# Patient Record
Sex: Male | Born: 1990 | Race: Black or African American | Hispanic: No | Marital: Single | Smoking: Current some day smoker
Health system: Southern US, Community
[De-identification: ages and names within clinical notes are randomized; demographics above are authoritative.]

---
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
## 2010-05-03 ENCOUNTER — Emergency Department (HOSPITAL_COMMUNITY): Admission: EM | Admit: 2010-05-03 | Discharge: 2010-05-03 | Payer: Self-pay | Admitting: Family Medicine

## 2014-04-28 ENCOUNTER — Emergency Department (HOSPITAL_COMMUNITY)
Admission: EM | Admit: 2014-04-28 | Discharge: 2014-04-28 | Disposition: A | Discharge status: Home / Self Care | Payer: Worker's Compensation | Attending: Emergency Medicine | Admitting: Emergency Medicine

## 2014-04-28 ENCOUNTER — Encounter (HOSPITAL_COMMUNITY): Payer: Self-pay | Admitting: Emergency Medicine

## 2014-04-28 DIAGNOSIS — S40022D Contusion of left upper arm, subsequent encounter: Secondary | ICD-10-CM

## 2014-04-28 DIAGNOSIS — G8911 Acute pain due to trauma: Secondary | ICD-10-CM | POA: Insufficient documentation

## 2014-04-28 DIAGNOSIS — M79609 Pain in unspecified limb: Secondary | ICD-10-CM | POA: Diagnosis not present

## 2014-04-28 DIAGNOSIS — T1490XA Injury, unspecified, initial encounter: Secondary | ICD-10-CM

## 2014-04-28 DIAGNOSIS — F17200 Nicotine dependence, unspecified, uncomplicated: Secondary | ICD-10-CM | POA: Insufficient documentation

## 2014-04-28 DIAGNOSIS — Y9229 Other specified public building as the place of occurrence of the external cause: Secondary | ICD-10-CM

## 2014-04-28 LAB — CBC WITH DIFFERENTIAL/PLATELET
Basophils Absolute: 0 10*3/uL (ref 0.0–0.1)
Basophils Relative: 0 % (ref 0–1)
EOS PCT: 2 % (ref 0–5)
Eosinophils Absolute: 0.1 10*3/uL (ref 0.0–0.7)
HEMATOCRIT: 44.9 % (ref 39.0–52.0)
Hemoglobin: 15.3 g/dL (ref 13.0–17.0)
LYMPHS ABS: 1.4 10*3/uL (ref 0.7–4.0)
Lymphocytes Relative: 22 % (ref 12–46)
MCH: 32.3 pg (ref 26.0–34.0)
MCHC: 34.1 g/dL (ref 30.0–36.0)
MCV: 94.9 fL (ref 78.0–100.0)
MONO ABS: 1 10*3/uL (ref 0.1–1.0)
Monocytes Relative: 16 % — ABNORMAL HIGH (ref 3–12)
Neutro Abs: 3.8 10*3/uL (ref 1.7–7.7)
Neutrophils Relative %: 60 % (ref 43–77)
PLATELETS: 260 10*3/uL (ref 150–400)
RBC: 4.73 MIL/uL (ref 4.22–5.81)
RDW: 12.2 % (ref 11.5–15.5)
WBC: 6.3 10*3/uL (ref 4.0–10.5)

## 2014-04-28 MED ORDER — HYDROCODONE-ACETAMINOPHEN 5-325 MG PO TABS: ORAL_TABLET | ORAL | Status: DC

## 2014-04-28 NOTE — ED Notes (Signed)
The patient said he collided with a boiler at work ([employer]).  He went to Urgent Care and had it xrayed and they said nothing was broken.  He is here because he is hurting worse and it is even more red and swollen.  He denies burning it, he said it was more of a "collision".

## 2014-04-28 NOTE — ED Provider Notes (Signed)
Chief Complaint    Chief Complaint  Patient presents with  . Arm Injury    The patient said he collided with a boiler at work [name].  He went to Urgent Care and had it xrayed and they said nothing was broken.  He is here because he is hurting worse and it is even more red and swollen.    History of Present Illness     Arthur Murray is a 23 year old male who injured his left forearm while at work one week ago. He turned suddenly, striking his left lower forearm against a steel broiler. This immediately swelled up and was painful, additionally, it has gotten progressively worse. There's some discoloration, erythema, and he has some decreased range of motion at the elbow. He's had no fever or chills. He went to an urgent care today called [facility]. He had an x-ray of the elbow which was negative. He brings his paperwork along with him. They referred him here for a venous Doppler. He denies any shortness of breath or chest pain. There is no numbness or tingling of the hand or weakness of the hand or arm  Review of Systems     Other than as noted above, the patient denies any of the following symptoms: Systemic:  No fevers or chills.   Musculoskeletal:  No joint pain, arthritis, back pain, or neck pain. Neurological:  No muscular weakness or paresthesias.  PMFSH    Past medical history, family history, social history, meds, and allergies were reviewed.    Physical Exam    Vital signs:  BP 141/77  Pulse 83  Temp(Src) 98.1 F (36.7 C) (Oral)  Resp 12  Ht 6' (1.829 m)  Wt 135 lb (61.236 kg)  BMI 18.31 kg/m2  SpO2 97% Gen:  Alert and oriented times 3.  In no distress. Musculoskeletal: He has swelling and erythema over the volar, proximal forearm, extending up to the antecubital fossa and down to about halfway along the forearm. This is red, hot, and swollen. It's somewhat tense to touch. He has good distal pulses, good capillary refill, normal sensation, and normal  muscle strength.  Otherwise, all joints had a full a ROM with no swelling, bruising or deformity.  No edema, pulses full. Extremities were warm and pink.  Capillary refill was brisk.  Skin:  Clear, warm and dry.  No rash. Neuro:  Alert and oriented times 3.  Muscle strength was normal.  Sensation was intact to light touch.    Labs   Results for orders placed during the hospital encounter of 04/28/14  CBC WITH DIFFERENTIAL      Result Value Ref Range   WBC 6.3  4.0 - 10.5 K/uL   RBC 4.73  4.22 - 5.81 MIL/uL   Hemoglobin 15.3  13.0 - 17.0 g/dL   HCT 16.1  09.6 - 04.5 %   MCV 94.9  78.0 - 100.0 fL   MCH 32.3  26.0 - 34.0 pg   MCHC 34.1  30.0 - 36.0 g/dL   RDW 40.9  81.1 - 91.4 %   Platelets 260  150 - 400 K/uL   Neutrophils Relative % 60  43 - 77 %   Neutro Abs 3.8  1.7 - 7.7 K/uL   Lymphocytes Relative 22  12 - 46 %   Lymphs Abs 1.4  0.7 - 4.0 K/uL   Monocytes Relative 16 (*) 3 - 12 %   Monocytes Absolute 1.0  0.1 - 1.0 K/uL  Eosinophils Relative 2  0 - 5 %   Eosinophils Absolute 0.1  0.0 - 0.7 K/uL   Basophils Relative 0  0 - 1 %   Basophils Absolute 0.0  0.0 - 0.1 K/uL   Course in Urgent Care Center   He was placed in an elbow sling. A venous Doppler was ordered for tomorrow morning. He is to proceed from there to occupational health. The report is to be called there.  Assessment    The primary encounter diagnosis was Traumatic hematoma of left upper arm, subsequent encounter. Diagnoses of Accidental injury, Place of occurrence, public building, and Occupational injury were also pertinent to this visit.  Plan   1.  Meds:  The following meds were prescribed:   New Prescriptions   HYDROCODONE-ACETAMINOPHEN (NORCO/VICODIN) 5-325 MG PER TABLET    1 to 2 tabs every 4 to 6 hours as needed for pain.    2.  Patient Education/Counseling:  The patient was given appropriate handouts, self care instructions, and instructed in symptomatic relief, including rest and activity,  elevation, application of ice and compression.    3.  Follow up:  The patient was told to follow up here if no better in 3 to 4 days, or sooner if becoming worse in any way, and given some red flag symptoms such as worsening pain or new neurological symptoms which would prompt immediate return.  Follow up at occupational medicine clinic after his venous Doppler.     Reuben Likes, MD 04/28/14 [phone]

## 2014-04-28 NOTE — Discharge Instructions (Signed)
Go to admitting office tomorrow at 8 a.m. For doppler.   Afterwards, follow up with Occupational health.

## 2014-04-29 ENCOUNTER — Ambulatory Visit (HOSPITAL_COMMUNITY)
Admission: RE | Admit: 2014-04-29 | Discharge: 2014-04-29 | Disposition: A | Discharge status: Home / Self Care | Payer: Self-pay | Source: Ambulatory Visit | Attending: Emergency Medicine | Admitting: Emergency Medicine

## 2014-04-29 DIAGNOSIS — M79609 Pain in unspecified limb: Secondary | ICD-10-CM | POA: Diagnosis not present

## 2014-04-29 DIAGNOSIS — R9389 Abnormal findings on diagnostic imaging of other specified body structures: Secondary | ICD-10-CM | POA: Insufficient documentation

## 2014-04-29 DIAGNOSIS — R229 Localized swelling, mass and lump, unspecified: Secondary | ICD-10-CM | POA: Insufficient documentation

## 2014-04-29 NOTE — Progress Notes (Signed)
VASCULAR LAB PRELIMINARY  PRELIMINARY  PRELIMINARY  PRELIMINARY  Left upper extremity venous duplex completed.    Preliminary report:  Left:  No evidence of DVT or superficial thrombosis.    At the area of interest, the echotexture of the musculature is abnormal appearing with vascularization noted.  There are no clear margins of this area.  Interstitial fluid also noted.  Further investigation may be warrented.  Libia Fazzini, RVT 04/29/2014, 10:08 AM

## 2014-04-30 ENCOUNTER — Encounter (HOSPITAL_COMMUNITY): Payer: Self-pay | Admitting: Emergency Medicine

## 2014-04-30 ENCOUNTER — Emergency Department (HOSPITAL_COMMUNITY)
Admission: EM | Admit: 2014-04-30 | Discharge: 2014-05-01 | Disposition: A | Discharge status: Home / Self Care | Payer: Worker's Compensation | Attending: Emergency Medicine | Admitting: Emergency Medicine

## 2014-04-30 DIAGNOSIS — S40022S Contusion of left upper arm, sequela: Secondary | ICD-10-CM

## 2014-04-30 DIAGNOSIS — IMO0002 Reserved for concepts with insufficient information to code with codable children: Secondary | ICD-10-CM | POA: Insufficient documentation

## 2014-04-30 DIAGNOSIS — F17200 Nicotine dependence, unspecified, uncomplicated: Secondary | ICD-10-CM | POA: Insufficient documentation

## 2014-04-30 DIAGNOSIS — Y9289 Other specified places as the place of occurrence of the external cause: Secondary | ICD-10-CM | POA: Diagnosis not present

## 2014-04-30 DIAGNOSIS — S40029A Contusion of unspecified upper arm, initial encounter: Secondary | ICD-10-CM | POA: Insufficient documentation

## 2014-04-30 DIAGNOSIS — W2209XA Striking against other stationary object, initial encounter: Secondary | ICD-10-CM | POA: Insufficient documentation

## 2014-04-30 DIAGNOSIS — L0291 Cutaneous abscess, unspecified: Secondary | ICD-10-CM

## 2014-04-30 DIAGNOSIS — L0390 Cellulitis, unspecified: Secondary | ICD-10-CM

## 2014-04-30 DIAGNOSIS — Y9389 Activity, other specified: Secondary | ICD-10-CM | POA: Diagnosis not present

## 2014-04-30 NOTE — ED Notes (Addendum)
Pt. reports progressing abscess/cellulitis at left forearm for several days , seen here 2 days ago for the same complaint discharged home with no improvement . Denies drainage .

## 2014-04-30 NOTE — ED Provider Notes (Signed)
CSN: [id number]     Arrival date & time 04/30/14  2326 History   None    Chief Complaint  Patient presents with  . Abscess     (Consider location/radiation/quality/duration/timing/severity/associated sxs/prior Treatment) HPI Comments: Arthur Murray is a 23 y.o. male with no known PMHx, presents to the ED today with concerns of his left elbow abscess which has progressively gotten worse. He states that on 8/20 he bumped his arm on a broiler door at work which caused a small blister to develop but no skin injury. He states that one week later he went to urgent care and subsequently came here to the ED for evaluation since the area had become more red and slightly more tender. At that time they gave him pain medications and had him followup for a ultrasound the next day. He states on 8/29, he had the ultrasound performed and went to occupational health as instructed, who drew a line around the area of redness. They gave him followup for Monday, but did not give him any other medications at that time including antibiotics. He states that the area became more red, more swollen, hot to touch, and more painful and he developed a temperature of 43F orally which prompted him to come to the ED tonight. He states the pain is 10/10 he constant nonradiating worse with movement of his elbow, and improves slightly with narcotics. Heating pad has not provided any relief. He states that this area has not been draining. Denies any red streaking, chills, diaphoresis, chest pain, shortness of breath, abdominal pain, nausea, vomiting, diarrhea, paresthesias, weakness, loss of sensation, or myalgias. Denies elbow swelling but states it is painful in the center of his antecubital area. Denies IVDA. Smokes 0.5ppd.  Patient is a 23 y.o. male presenting with abscess. The history is provided by the patient. No language interpreter was used.  Abscess Location:  Shoulder/arm Shoulder/arm abscess location:  L elbow Size:   Golf-ball Abscess quality: fluctuance, induration, painful, redness and warmth   Abscess quality: not draining, no itching and not weeping   Red streaking: no   Duration:  2 days Progression:  Worsening Pain details:    Quality:  Aching   Severity:  Severe (10/10)   Duration:  2 days   Timing:  Constant   Progression:  Worsening Chronicity:  New Context: skin injury ("bumped" arm on broiler door on 8/20 with small blisters appearing after without opening of skin)   Context: not injected drug use and not insect bite/sting   Relieved by: narcotics. Exacerbated by: movement. Ineffective treatments:  Warm compresses Associated symptoms: no anorexia, no fatigue, no fever (subjective 43F oral last night), no headaches, no nausea and no vomiting   Risk factors: no prior abscess     History reviewed. No pertinent past medical history. History reviewed. No pertinent past surgical history. No family history on file. History  Substance Use Topics  . Smoking status: Current Every Day Smoker -- 0.50 packs/day    Types: Cigarettes  . Smokeless tobacco: Never Used  . Alcohol Use: Yes    Review of Systems  Constitutional: Negative for fever (subjective 43F oral last night), chills, diaphoresis and fatigue.  HENT: Negative for congestion.   Eyes: Negative for pain.  Respiratory: Negative for cough, chest tightness and shortness of breath.   Cardiovascular: Negative for chest pain and leg swelling.  Gastrointestinal: Negative for nausea, vomiting, abdominal pain, diarrhea and anorexia.  Genitourinary: Negative for dysuria and hematuria.  Musculoskeletal: Positive for  arthralgias (L elbow). Negative for joint swelling and myalgias.  Skin: Positive for color change (erythema to L forearm). Negative for wound.  Allergic/Immunologic: Negative for immunocompromised state.  Neurological: Negative for dizziness, syncope, weakness, light-headedness, numbness and headaches.  Hematological: Negative  for adenopathy. Does not bruise/bleed easily.  Psychiatric/Behavioral: Negative for confusion.  10 Systems reviewed and are negative for acute change except as noted in the HPI.     Allergies  Review of patient's allergies indicates no known allergies.  Home Medications   Prior to Admission medications   Medication Sig Start Date End Date Taking? Authorizing Provider  HYDROcodone-acetaminophen (NORCO/VICODIN) 5-325 MG per tablet 1 to 2 tabs every 4 to 6 hours as needed for pain. 04/28/14  Yes Reuben Likes, MD  doxycycline (VIBRAMYCIN) 100 MG capsule Take 1 capsule (100 mg total) by mouth 2 (two) times daily. One po bid x 7 days 05/01/14   Jessee Mezera Strupp Camprubi-Soms, PA-C  naproxen (NAPROSYN) 500 MG tablet Take 1 tablet (500 mg total) by mouth 2 (two) times daily as needed for mild pain, moderate pain or headache (TAKE WITH MEALS.). 05/01/14   Oisin Yoakum Strupp Camprubi-Soms, PA-C  oxyCODONE-acetaminophen (PERCOCET) 5-325 MG per tablet Take 1-2 tablets by mouth every 6 (six) hours as needed for severe pain. 05/01/14   Joakim Huesman Strupp Camprubi-Soms, PA-C   BP 152/84  Pulse 101  Temp(Src) 99.7 F (37.6 C) (Oral)  Resp 18  SpO2 100% Physical Exam  Nursing note and vitals reviewed. Constitutional: He is oriented to person, place, and time. Vital signs are normal. He appears well-developed and well-nourished.  Non-toxic appearance. No distress.  Oral temp 99.9 during exam, no recent eating/drinking/smoking. Nontoxic appearing, NAD  HENT:  Head: Normocephalic and atraumatic.  Mouth/Throat: Oropharynx is clear and moist and mucous membranes are normal.  Eyes: Conjunctivae and EOM are normal. Right eye exhibits no discharge. Left eye exhibits no discharge.  Neck: Normal range of motion. Neck supple.  Cardiovascular: Normal rate, regular rhythm, normal heart sounds and intact distal pulses.  Exam reveals no gallop and no friction rub.   No murmur heard. Distal pulses intact, cap refill  intact in all digits  Pulmonary/Chest: Effort normal and breath sounds normal. No respiratory distress. He has no decreased breath sounds. He has no wheezes. He has no rhonchi. He has no rales.  Abdominal: Soft. Normal appearance and bowel sounds are normal. He exhibits no distension. There is no tenderness. There is no rigidity, no rebound and no guarding.  Musculoskeletal:       Left elbow: He exhibits decreased range of motion and swelling. He exhibits no effusion. Tenderness found.       Arms: L elbow with limited extension but full flexion. No joint effusion or TTP in jointline or along bony prominences. Moderate swelling and erythema to skin overlying L antecubital area extending from mid-forearm up to mid-humerus surrounding an abscess measuring approx 6cm in diameter which has fluctuance centrally. Mild induration surrounding abscess, skin appears tense and with some orange-peel appearance. Area warm to touch and TTP. Wrist and shoulder with FROM and nonTTP.   Lymphadenopathy:    He has no axillary adenopathy.  Neurological: He is alert and oriented to person, place, and time. He has normal strength. No sensory deficit.  Strength 5/5 in all extremities, sensation grossly intact in all extremities  Skin: Skin is warm, dry and intact. There is erythema.     Abscess with 6cm of fluctuance and mild induration around this area to L  antecubital area with surrounding swelling, erythema, warmth, and TTP as noted above  Psychiatric: He has a normal mood and affect.    ED Course  INCISION AND DRAINAGE Date/Time: 05/01/2014 1:35 AM Performed by: Marjean Donna, Bethene Hankinson STRUPP Authorized by: Ramond Marrow Consent: Verbal consent obtained. written consent not obtained. Risks and benefits: risks, benefits and alternatives were discussed Consent given by: patient Patient understanding: patient states understanding of the procedure being performed Patient consent: the patient's  understanding of the procedure matches consent given Patient identity confirmed: verbally with patient Type: abscess Body area: upper extremity Location details: left elbow Anesthesia: local infiltration Local anesthetic: lidocaine 2% without epinephrine Anesthetic total: 3 ml Patient sedated: no Scalpel size: 11 Needle gauge: 22 Incision type: single straight Complexity: complex Drainage: purulent and  bloody Drainage amount: copious Packing material: 1/4 in iodoform gauze Patient tolerance: Patient tolerated the procedure well with no immediate complications.   (including critical care time) Labs Review Labs Reviewed  WOUND CULTURE    Imaging Review Dg Elbow Complete Left  05/01/2014   CLINICAL DATA:  Abscess.  Cellulitis.  EXAM: LEFT ELBOW - COMPLETE 3+ VIEW  COMPARISON:  None.  FINDINGS: The elbow is located. There is no significant effusion. Soft tissue swelling is noted medial and anterior to the ulna. Radiopaque packing material is evident.  IMPRESSION: 1. Packing material within the soft tissues of the proximal left forearm. 2. The elbow is intact.   Electronically Signed   By: Gennette Pac M.D.   On: 05/01/2014 01:58   LUE venous duplex 04/29/14: VASCULAR LAB PRELIMINARY PRELIMINARY PRELIMINARY PRELIMINARY  Left upper extremity venous duplex completed.  Preliminary report: Left: No evidence of DVT or superficial thrombosis.  At the area of interest, the echotexture of the musculature is abnormal appearing with vascularization noted. There are no clear margins of this area. Interstitial fluid also noted. Further investigation may be warrented.  CESTONE, HELENE, RVT  04/29/2014, 10:08 AM   EKG Interpretation None      MDM   Final diagnoses:  Abscess and cellulitis  Hematoma of arm, left, sequela    23y/o male with hematoma on 04/21/14 which has developed into abscess, never given abx. DVT study neg but showed interstitial fluid. Bedside u/s reveals large abscess  without underlying vessels. Pt never truly febrile here, no concern for SIRS/sepsis, and relatively healthy aside from smoking. I&D performed with bloody purulent drainage, copious amount. Packing placed. Discussed dose of abx here, with continuation of PO doxy x7d to trial outpt management given low comorbidities therefore could be amendable to outpt tx vs inpatient abx. Discussed return precautions and that if this does not improve by Monday, will need IV abx. Has appt Monday, will have them reassess and remove packing. Obtained xray here to eval for joint effusion, which was neg. Percocet given here with relief of pain, and pain dramatically improved after I&D. rx for percocet, naprosyn, and doxy given. Discussed heat, and dressing changes. Advised to stop smoking, and importance this had on wound healing. I explained the diagnosis and have given explicit precautions to return to the ER including for any other new or worsening symptoms. The patient understands and accepts the medical plan as it's been dictated and I have answered their questions. Discharge instructions concerning home care and prescriptions have been given. The patient is STABLE and is discharged to home in good condition.  BP 139/90  Pulse 89  Temp(Src) 99.1 F (37.3 C) (Oral)  Resp 18  SpO2 98%  Meds ordered this encounter  Medications  . oxyCODONE-acetaminophen (PERCOCET/ROXICET) 5-325 MG per tablet 1 tablet    Sig:   . lidocaine (XYLOCAINE) 2 % (with pres) injection 200 mg    Sig:   . doxycycline (VIBRA-TABS) tablet 100 mg    Sig:   . doxycycline (VIBRAMYCIN) 100 MG capsule    Sig: Take 1 capsule (100 mg total) by mouth 2 (two) times daily. One po bid x 7 days    Dispense:  14 capsule    Refill:  0    Order Specific Question:  Supervising Provider    Answer:  Eber Hong D [[id number]]  . oxyCODONE-acetaminophen (PERCOCET) 5-325 MG per tablet    Sig: Take 1-2 tablets by mouth every 6 (six) hours as needed for severe pain.      Dispense:  10 tablet    Refill:  0    Order Specific Question:  Supervising Provider    Answer:  Eber Hong D [[id number]]  . naproxen (NAPROSYN) 500 MG tablet    Sig: Take 1 tablet (500 mg total) by mouth 2 (two) times daily as needed for mild pain, moderate pain or headache (TAKE WITH MEALS.).    Dispense:  20 tablet    Refill:  0    Order Specific Question:  Supervising Provider    Answer:  Vida Roller [address] Camprubi-Soms, PA-C 05/01/14 [phone]

## 2014-05-01 ENCOUNTER — Emergency Department (HOSPITAL_COMMUNITY): Payer: Worker's Compensation

## 2014-05-01 MED ORDER — OXYCODONE-ACETAMINOPHEN 5-325 MG PO TABS
1.0000 | ORAL_TABLET | Freq: Once | ORAL | Status: AC
  Administered 2014-05-01: 1 via ORAL
  Filled 2014-05-01: qty 1

## 2014-05-01 MED ORDER — NAPROXEN 500 MG PO TABS: 500.0000 mg | ORAL_TABLET | Freq: Two times a day (BID) | ORAL | Status: DC | PRN

## 2014-05-01 MED ORDER — OXYCODONE-ACETAMINOPHEN 5-325 MG PO TABS: 1.0000 | ORAL_TABLET | Freq: Four times a day (QID) | ORAL | Status: DC | PRN

## 2014-05-01 MED ORDER — DOXYCYCLINE HYCLATE 100 MG PO CAPS
100.0000 mg | ORAL_CAPSULE | Freq: Two times a day (BID) | ORAL | Status: DC
Start: 2014-05-01 — End: 2017-05-23

## 2014-05-01 MED ORDER — DOXYCYCLINE HYCLATE 100 MG PO TABS
100.0000 mg | ORAL_TABLET | Freq: Once | ORAL | Status: AC
  Administered 2014-05-01: 100 mg via ORAL
  Filled 2014-05-01: qty 1

## 2014-05-01 MED ORDER — LIDOCAINE HCL 2 % IJ SOLN
10.0000 mL | Freq: Once | INTRAMUSCULAR | Status: AC
  Administered 2014-05-01: 200 mg
  Filled 2014-05-01: qty 20

## 2014-05-01 NOTE — Discharge Instructions (Signed)
Keep wound clean and dry. Apply warm compresses to affected area throughout the day. Take antibiotic until it is finished, and avoid direct sunlight while taking this medication. Take naprosyn and percocet as directed, as needed for pain but do not drive or operate machinery with pain medication use. Followup with your occupational health clinic at your regularly scheduled appointment in 2 days for wound recheck and packing removal.  Return to emergency department for emergent changing or worsening symptoms.   Abscess An abscess (boil or furuncle) is an infected area on or under the skin. This area is filled with yellowish-white fluid (pus) and other material (debris). HOME CARE   Only take medicines as told by your doctor.  If you were given antibiotic medicine, take it as directed. Finish the medicine even if you start to feel better.  If gauze is used, follow your doctor's directions for changing the gauze.  To avoid spreading the infection:  Keep your abscess covered with a bandage.  Wash your hands well.  Do not share personal care items, towels, or whirlpools with others.  Avoid skin contact with others.  Keep your skin and clothes clean around the abscess.  Keep all doctor visits as told. GET HELP RIGHT AWAY IF:   You have more pain, puffiness (swelling), or redness in the wound site.  You have more fluid or blood coming from the wound site.  You have muscle aches, chills, or you feel sick.  You have a fever. MAKE SURE YOU:   Understand these instructions.  Will watch your condition.  Will get help right away if you are not doing well or get worse. Document Released: 02/05/2008 Document Revised: 02/18/2012 Document Reviewed: 11/01/2011 [facility] Patient Information 2015 [place], [place]. This information is not intended to replace advice given to you by your health care provider. Make sure you discuss any questions you have with your health care  provider.  Abscess Care After An abscess (also called a boil or furuncle) is an infected area that contains a collection of pus. Signs and symptoms of an abscess include pain, tenderness, redness, or hardness, or you may feel a moveable soft area under your skin. An abscess can occur anywhere in the body. The infection may spread to surrounding tissues causing cellulitis. A cut (incision) by the surgeon was made over your abscess and the pus was drained out. Gauze may have been packed into the space to provide a drain that will allow the cavity to heal from the inside outwards. The boil may be painful for 5 to 7 days. Most people with a boil do not have high fevers. Your abscess, if seen early, may not have localized, and may not have been lanced. If not, another appointment may be required for this if it does not get better on its own or with medications. HOME CARE INSTRUCTIONS   Only take over-the-counter or prescription medicines for pain, discomfort, or fever as directed by your caregiver.  When you bathe, soak and then remove gauze or iodoform packs at least daily or as directed by your caregiver. You may then wash the wound gently with mild soapy water. Repack with gauze or do as your caregiver directs. SEEK IMMEDIATE MEDICAL CARE IF:   You develop increased pain, swelling, redness, drainage, or bleeding in the wound site.  You develop signs of generalized infection including muscle aches, chills, fever, or a general ill feeling.  An oral temperature above 102 F (38.9 C) develops, not controlled by medication. See  your caregiver for a recheck if you develop any of the symptoms described above. If medications (antibiotics) were prescribed, take them as directed. Document Released: 03/07/2005 Document Revised: 11/11/2011 Document Reviewed: 11/02/2007 [facility] Patient Information 2015 [place], [place]. This information is not intended to replace advice given to you by your health care  provider. Make sure you discuss any questions you have with your health care provider.  Cellulitis Cellulitis is an infection of the skin and the tissue under the skin. The infected area is usually red and tender. This happens most often in the arms and lower legs. HOME CARE   Take your antibiotic medicine as told. Finish the medicine even if you start to feel better.  Keep the infected arm or leg raised (elevated).  Put a warm cloth on the area up to 4 times per day.  Only take medicines as told by your doctor.  Keep all doctor visits as told. GET HELP IF:  You see red streaks on the skin coming from the infected area.  Your red area gets bigger or turns a dark color.  Your bone or joint under the infected area is painful after the skin heals.  Your infection comes back in the same area or different area.  You have a puffy (swollen) bump in the infected area.  You have new symptoms.  You have a fever. GET HELP RIGHT AWAY IF:   You feel very sleepy.  You throw up (vomit) or have watery poop (diarrhea).  You feel sick and have muscle aches and pains. MAKE SURE YOU:   Understand these instructions.  Will watch your condition.  Will get help right away if you are not doing well or get worse. Document Released: 02/05/2008 Document Revised: 01/03/2014 Document Reviewed: 11/04/2011 [facility] Patient Information 2015 [place], [place]. This information is not intended to replace advice given to you by your health care provider. Make sure you discuss any questions you have with your health care provider.

## 2014-05-01 NOTE — ED Provider Notes (Signed)
Patient has large abscess with surrounding cellulitis.  Will I&D, send home with antibiotics.  He has close follow up with PCP in 2 days for wound check.  Medical screening examination/treatment/procedure(s) were conducted as a shared visit with non-physician practitioner(s) and myself.  I personally evaluated the patient during the encounter.   EKG Interpretation None        Tomasita Crumble, MD 05/01/14 [phone]

## 2014-05-03 ENCOUNTER — Telehealth (HOSPITAL_COMMUNITY): Payer: Self-pay

## 2014-05-03 LAB — WOUND CULTURE: GRAM STAIN: NONE SEEN

## 2014-05-04 ENCOUNTER — Telehealth (HOSPITAL_BASED_OUTPATIENT_CLINIC_OR_DEPARTMENT_OTHER): Payer: Self-pay | Admitting: Emergency Medicine

## 2014-05-05 ENCOUNTER — Telehealth (HOSPITAL_BASED_OUTPATIENT_CLINIC_OR_DEPARTMENT_OTHER): Payer: Self-pay | Admitting: Emergency Medicine

## 2014-05-05 NOTE — Telephone Encounter (Signed)
Post ED Visit - Positive Culture Follow-up  Culture report reviewed by antimicrobial stewardship pharmacist:  Wes Dulaney, Pharm.D., BCPS  Celedonio Miyamoto, Pharm.D., BCPS  Georgina Pillion, [address].D., BCPS  [place], [address].D., BCPS, AAHIVP  Estella Husk, Pharm.D., BCPS, AAHIVP  Carly Sabat, Pharm.D.  Enzo Bi, [address].D.  Positive wound culture Treated with Doxycycline   caos bid x 7 day, organism sensitive to the same and no further patient follow-up is required at this time.  Berle Mull 05/05/2014, 2:02 PM

## 2017-04-04 ENCOUNTER — Emergency Department (HOSPITAL_COMMUNITY)
Admission: EM | Admit: 2017-04-04 | Discharge: 2017-04-04 | Disposition: A | Discharge status: Home / Self Care | Payer: Self-pay | Attending: Emergency Medicine | Admitting: Emergency Medicine

## 2017-04-04 ENCOUNTER — Encounter (HOSPITAL_COMMUNITY): Payer: Self-pay | Admitting: Emergency Medicine

## 2017-04-04 DIAGNOSIS — F17210 Nicotine dependence, cigarettes, uncomplicated: Secondary | ICD-10-CM | POA: Insufficient documentation

## 2017-04-04 DIAGNOSIS — T50901A Poisoning by unspecified drugs, medicaments and biological substances, accidental (unintentional), initial encounter: Secondary | ICD-10-CM | POA: Insufficient documentation

## 2017-04-04 DIAGNOSIS — Z79899 Other long term (current) drug therapy: Secondary | ICD-10-CM | POA: Insufficient documentation

## 2017-04-04 NOTE — ED Notes (Signed)
Bed: WA25 Expected date:  Expected time:  Means of arrival:  Comments: OD 

## 2017-04-04 NOTE — ED Triage Notes (Signed)
Per EMS, received a call for cardiac arrest when EMS arrived pt was unconscious and lethargic with pupils pinpoint. Pt aroused with sternal rub. No respiratory distress noted. Per ems pt states he has taken 3 percocet, 1/2 xanax, and marijuana. Pt A&O on arrival.

## 2017-04-04 NOTE — ED Provider Notes (Signed)
[facility]-EMERGENCY DEPT Provider Note   CSN: [id number] Arrival date & time: 04/04/17  1843     History   Chief Complaint No chief complaint on file.   HPI Georgia Duffllen L Math is a 26 y.o. male.  HPI Patient with apparent overdose. Found unconscious and arouses to sternal rub. Had shallow breaths and pinpoint pupils. Reportedly took 3 Percocet a half a Xanax and some marijuana. States he was doing to get high. Now alert and oriented but still sleepy. States he had not used much in a while. 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 Denies suicidal or homicidal intent. Denies alcohol use. Used around 5:00.   History reviewed. No pertinent past medical history.  There are no active problems to display for this patient.   History reviewed. No pertinent surgical history.     Home Medications    Prior to Admission medications   Medication Sig Start Date End Date Taking? Authorizing Provider  ALPRAZolam (XANAX PO) Take 0.5 tablets by mouth once.   Yes [provider]  oxyCODONE-acetaminophen (PERCOCET) 10-325 MG tablet Take 4 tablets by mouth once.   Yes [provider]  doxycycline (VIBRAMYCIN) 100 MG capsule Take 1 capsule (100 mg total) by mouth 2 (two) times daily. One po bid x 7 days Patient not taking: Reported on 04/04/2017 05/01/14   Street, [place], PA-C  HYDROcodone-acetaminophen (NORCO/VICODIN) 5-325 MG per tablet 1 to 2 tabs every 4 to 6 hours as needed for pain. Patient not taking: Reported on 04/04/2017 04/28/14   Reuben LikesKeller, David C, MD  naproxen (NAPROSYN) 500 MG tablet Take 1 tablet (500 mg total) by mouth 2 (two) times daily as needed for mild pain, moderate pain or headache (TAKE WITH MEALS.). Patient not taking: Reported on 04/04/2017 05/01/14   Street, [place], PA-C  oxyCODONE-acetaminophen (PERCOCET) 5-325 MG per tablet Take 1-2 tablets by mouth every 6 (six) hours as needed for severe pain. Patient not taking: Reported on 04/04/2017 05/01/14   Street, [place], [place]C      Family History History reviewed. No pertinent family history.  Social History Social History  Substance Use Topics  . Smoking status: Current Some Day Smoker    Packs/day: 0.50    Types: Cigarettes  . Smokeless tobacco: Never Used  . Alcohol use Yes     Allergies   Shellfish allergy   Review of Systems Review of Systems  Constitutional: Negative for appetite change and fever.  Respiratory: Negative for shortness of breath.   Cardiovascular: Negative for chest pain.  Gastrointestinal: Negative for abdominal distention.  Genitourinary: Negative for enuresis and flank pain.  Psychiatric/Behavioral: Positive for confusion. Negative for suicidal ideas.     Physical Exam Updated Vital Signs BP 135/85   Pulse 78   Temp (!) 97.5 F (36.4 C) (Oral)   Resp 11   Ht 6' (1.829 m)   Wt 65.8 kg (145 lb)   SpO2 94%   BMI 19.67 kg/m   Physical Exam  Constitutional: He appears well-developed.  HENT:  Head: Atraumatic.  Eyes:  Pupils somewhat constricted but reactive  Neck: Neck supple.  Cardiovascular:  Mild tachycardia  Pulmonary/Chest: Effort normal.  Abdominal: Soft.  Musculoskeletal: He exhibits no edema.  Neurological: He is alert.  Skin: Skin is warm. Capillary refill takes less than 2 seconds.  Psychiatric: He has a normal mood and affect.     ED Treatments / Results  Labs (all labs ordered are listed, but only abnormal results are displayed) Labs Reviewed - No data  to display  EKG  EKG Interpretation None       Radiology No results found.  Procedures Procedures (including critical care time)  Medications Ordered in ED Medications - No data to display   Initial Impression / Assessment and Plan / ED Course  I have reviewed the triage vital signs and the nursing notes.  Pertinent labs & imaging results that were available during my care of the patient were reviewed by me and considered in my medical decision making (see chart for  details).     Patient with overdose. Accidental. Recently back on opiates. Resources given. Mental status is at baseline. Discharge home.  Final Clinical Impressions(s) / ED Diagnoses   Final diagnoses:  Accidental drug overdose, initial encounter    New Prescriptions Discharge Medication List as of 04/04/2017  9:37 PM       Benjiman CorePickering, Arnie Clingenpeel, MD 04/05/17 0005

## 2017-05-23 ENCOUNTER — Emergency Department (HOSPITAL_COMMUNITY): Payer: Self-pay

## 2017-05-23 ENCOUNTER — Emergency Department (HOSPITAL_COMMUNITY)
Admission: EM | Admit: 2017-05-23 | Discharge: 2017-05-24 | Disposition: A | Discharge status: Home / Self Care | Payer: Self-pay | Attending: Emergency Medicine | Admitting: Emergency Medicine

## 2017-05-23 DIAGNOSIS — R410 Disorientation, unspecified: Secondary | ICD-10-CM | POA: Insufficient documentation

## 2017-05-23 DIAGNOSIS — G9340 Encephalopathy, unspecified: Secondary | ICD-10-CM | POA: Insufficient documentation

## 2017-05-23 DIAGNOSIS — Z79899 Other long term (current) drug therapy: Secondary | ICD-10-CM | POA: Insufficient documentation

## 2017-05-23 DIAGNOSIS — F17210 Nicotine dependence, cigarettes, uncomplicated: Secondary | ICD-10-CM | POA: Insufficient documentation

## 2017-05-23 LAB — I-STAT CHEM 8, ED
BUN: 17 mg/dL (ref 6–20)
CREATININE: 1 mg/dL (ref 0.61–1.24)
Calcium, Ion: 1.16 mmol/L (ref 1.15–1.40)
Chloride: 100 mmol/L — ABNORMAL LOW (ref 101–111)
Glucose, Bld: 101 mg/dL — ABNORMAL HIGH (ref 65–99)
HCT: 51 % (ref 39.0–52.0)
HEMOGLOBIN: 17.3 g/dL — AB (ref 13.0–17.0)
Potassium: 5.2 mmol/L — ABNORMAL HIGH (ref 3.5–5.1)
Sodium: 137 mmol/L (ref 135–145)
TCO2: 29 mmol/L (ref 22–32)

## 2017-05-23 LAB — RAPID URINE DRUG SCREEN, HOSP PERFORMED
Amphetamines: NOT DETECTED
BARBITURATES: NOT DETECTED
Benzodiazepines: NOT DETECTED
Cocaine: NOT DETECTED
Opiates: NOT DETECTED
Tetrahydrocannabinol: POSITIVE — AB

## 2017-05-23 LAB — URINALYSIS, COMPLETE (UACMP) WITH MICROSCOPIC
Bilirubin Urine: NEGATIVE
Glucose, UA: NEGATIVE mg/dL
HGB URINE DIPSTICK: NEGATIVE
KETONES UR: NEGATIVE mg/dL
LEUKOCYTES UA: NEGATIVE
NITRITE: NEGATIVE
Protein, ur: NEGATIVE mg/dL
SPECIFIC GRAVITY, URINE: 1.026 (ref 1.005–1.030)
Squamous Epithelial / LPF: NONE SEEN
pH: 6 (ref 5.0–8.0)

## 2017-05-23 LAB — CBC WITH DIFFERENTIAL/PLATELET
BASOS ABS: 0 10*3/uL (ref 0.0–0.1)
BASOS PCT: 0 %
Eosinophils Absolute: 0 10*3/uL (ref 0.0–0.7)
Eosinophils Relative: 0 %
HCT: 47.5 % (ref 39.0–52.0)
HEMOGLOBIN: 15.5 g/dL (ref 13.0–17.0)
LYMPHS PCT: 19 %
Lymphs Abs: 1 10*3/uL (ref 0.7–4.0)
MCH: 31.6 pg (ref 26.0–34.0)
MCHC: 32.6 g/dL (ref 30.0–36.0)
MCV: 96.7 fL (ref 78.0–100.0)
MONO ABS: 0.5 10*3/uL (ref 0.1–1.0)
Monocytes Relative: 9 %
NEUTROS ABS: 3.6 10*3/uL (ref 1.7–7.7)
NEUTROS PCT: 72 %
Platelets: 239 10*3/uL (ref 150–400)
RBC: 4.91 MIL/uL (ref 4.22–5.81)
RDW: 12.7 % (ref 11.5–15.5)
WBC: 5.1 10*3/uL (ref 4.0–10.5)

## 2017-05-23 LAB — COMPREHENSIVE METABOLIC PANEL
ALK PHOS: 72 U/L (ref 38–126)
ALT: 53 U/L (ref 17–63)
ANION GAP: 6 (ref 5–15)
AST: 32 U/L (ref 15–41)
Albumin: 4.5 g/dL (ref 3.5–5.0)
BILIRUBIN TOTAL: 0.8 mg/dL (ref 0.3–1.2)
BUN: 14 mg/dL (ref 6–20)
CALCIUM: 10 mg/dL (ref 8.9–10.3)
CO2: 27 mmol/L (ref 22–32)
Chloride: 102 mmol/L (ref 101–111)
Creatinine, Ser: 1.04 mg/dL (ref 0.61–1.24)
GFR calc non Af Amer: >60 mL/min (ref >60)
GLUCOSE: 102 mg/dL — AB (ref 65–99)
Potassium: 5.3 mmol/L — ABNORMAL HIGH (ref 3.5–5.1)
Sodium: 135 mmol/L (ref 135–145)
Total Protein: 7.8 g/dL (ref 6.5–8.1)

## 2017-05-23 LAB — CBG MONITORING, ED: Glucose-Capillary: 105 mg/dL — ABNORMAL HIGH (ref 65–99)

## 2017-05-23 LAB — CK: CK TOTAL: 359 U/L (ref 49–397)

## 2017-05-23 NOTE — ED Provider Notes (Signed)
Received signout at beginning of shift.  Pt presents with AMS since last night.  Pt may have ingested some unknown substance, causing his AMS.  Labs are reassuring.  Plan to continue to monitor and allow time for body to metabolizing said substance.     7:02 PM The patient has been monitored in the ED for the past 11 hours. He is improving and becoming a bit more alert. He admits that he may have used some illicit drugs, unsure what type. Does admits to having visual hallucination. Aside from a mild headache he has no other complaint. We'll continue to monitor and will discharge patient once he is able to ambulate.  10:49 PM Pt is able to ambulate, became much more alert.  Family felt comfortable taking pt home.  Encourage drug use cessation, pt agrees. Return precaution given.    BP 110/76 (BP Location: Left Arm)   Pulse 84   Temp 98.8 F (37.1 C) (Oral)   Resp 13   Wt 65.8 kg (145 lb)   SpO2 97%   BMI 19.67 kg/m   Results for orders placed or performed during the hospital encounter of 05/23/17  Comprehensive metabolic panel  Result Value Ref Range   Sodium 135 135 - 145 mmol/L   Potassium 5.3 (H) 3.5 - 5.1 mmol/L   Chloride 102 101 - 111 mmol/L   CO2 27 22 - 32 mmol/L   Glucose, Bld 102 (H) 65 - 99 mg/dL   BUN 14 6 - 20 mg/dL   Creatinine, Ser 1.61 0.61 - 1.24 mg/dL   Calcium 09.6 8.9 - 04.5 mg/dL   Total Protein 7.8 6.5 - 8.1 g/dL   Albumin 4.5 3.5 - 5.0 g/dL   AST 32 15 - 41 U/L   ALT 53 17 - 63 U/L   Alkaline Phosphatase 72 38 - 126 U/L   Total Bilirubin 0.8 0.3 - 1.2 mg/dL   GFR calc non Af Amer >60 >60 mL/min   GFR calc Af Amer >60 >60 mL/min   Anion gap 6 5 - 15  CBC WITH DIFFERENTIAL  Result Value Ref Range   WBC 5.1 4.0 - 10.5 K/uL   RBC 4.91 4.22 - 5.81 MIL/uL   Hemoglobin 15.5 13.0 - 17.0 g/dL   HCT 40.9 81.1 - 91.4 %   MCV 96.7 78.0 - 100.0 fL   MCH 31.6 26.0 - 34.0 pg   MCHC 32.6 30.0 - 36.0 g/dL   RDW 78.2 95.6 - 21.3 %   Platelets 239 150 - 400 K/uL   Neutrophils Relative % 72 %   Neutro Abs 3.6 1.7 - 7.7 K/uL   Lymphocytes Relative 19 %   Lymphs Abs 1.0 0.7 - 4.0 K/uL   Monocytes Relative 9 %   Monocytes Absolute 0.5 0.1 - 1.0 K/uL   Eosinophils Relative 0 %   Eosinophils Absolute 0.0 0.0 - 0.7 K/uL   Basophils Relative 0 %   Basophils Absolute 0.0 0.0 - 0.1 K/uL  Urinalysis, Complete w Microscopic  Result Value Ref Range   Color, Urine YELLOW YELLOW   APPearance CLEAR CLEAR   Specific Gravity, Urine 1.026 1.005 - 1.030   pH 6.0 5.0 - 8.0   Glucose, UA NEGATIVE NEGATIVE mg/dL   Hgb urine dipstick NEGATIVE NEGATIVE   Bilirubin Urine NEGATIVE NEGATIVE   Ketones, ur NEGATIVE NEGATIVE mg/dL   Protein, ur NEGATIVE NEGATIVE mg/dL   Nitrite NEGATIVE NEGATIVE   Leukocytes, UA NEGATIVE NEGATIVE   RBC / HPF 0-5 0 -  5 RBC/hpf   WBC, UA 0-5 0 - 5 WBC/hpf   Bacteria, UA RARE (A) NONE SEEN   Squamous Epithelial / LPF NONE SEEN NONE SEEN   Mucus PRESENT   Urine rapid drug screen (hosp performed)  Result Value Ref Range   Opiates NONE DETECTED NONE DETECTED   Cocaine NONE DETECTED NONE DETECTED   Benzodiazepines NONE DETECTED NONE DETECTED   Amphetamines NONE DETECTED NONE DETECTED   Tetrahydrocannabinol POSITIVE (A) NONE DETECTED   Barbiturates NONE DETECTED NONE DETECTED  CK  Result Value Ref Range   Total CK 359 49 - 397 U/L  CBG monitoring, ED  Result Value Ref Range   Glucose-Capillary 105 (H) 65 - 99 mg/dL   Comment 1 Notify RN    Comment 2 Document in Chart   I-Stat Chem 8, ED  Result Value Ref Range   Sodium 137 135 - 145 mmol/L   Potassium 5.2 (H) 3.5 - 5.1 mmol/L   Chloride 100 (L) 101 - 111 mmol/L   BUN 17 6 - 20 mg/dL   Creatinine, Ser 1.61 0.61 - 1.24 mg/dL   Glucose, Bld 096 (H) 65 - 99 mg/dL   Calcium, Ion 0.45 4.09 - 1.40 mmol/L   TCO2 29 22 - 32 mmol/L   Hemoglobin 17.3 (H) 13.0 - 17.0 g/dL   HCT 81.1 91.4 - 78.2 %   Ct Head Wo Contrast  Result Date: 05/23/2017 CLINICAL DATA:  Altered mental status with  transient episode of unresponsiveness EXAM: CT HEAD WITHOUT CONTRAST TECHNIQUE: Contiguous axial images were obtained from the base of the skull through the vertex without intravenous contrast. COMPARISON:  None. FINDINGS: Brain: The ventricles are normal in size and configuration. There is no intracranial mass, hemorrhage, extra-axial fluid collection, or midline shift. Gray-white compartments appear normal. No evident acute infarct. Vascular: No hyperdense vessel. There is no appreciable arterial vascular calcification. Skull: Bony calvarium appears intact. Sinuses/Orbits: There is mild mucosal thickening in several ethmoid air cells bilaterally. Frontal sinuses are nearly aplastic. Visualized paranasal sinuses elsewhere clear. Orbits appear symmetric bilaterally. Other: Mastoid air cells are clear. There is mild debris in the left external auditory canal. IMPRESSION: Mucosal thickening in several ethmoid air cells. Probable cerumen in the left external auditory canal. No intracranial mass, hemorrhage, or extra-axial fluid collection. Gray-white compartments appear normal. Electronically Signed   By: Bretta Bang III M.D.   On: 05/23/2017 11:17      Fayrene Helper, PA-C 05/23/17 2250    Tilden Fossa, MD 05/24/17 [phone]

## 2017-05-23 NOTE — ED Notes (Signed)
Pt is alert and oriented, remains lethargic, family at bedside.

## 2017-05-23 NOTE — ED Notes (Signed)
Patient transported to CT 
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
## 2017-05-23 NOTE — ED Provider Notes (Signed)
[facility]-EMERGENCY DEPT Provider Note   CSN: [id number] Arrival date & time: 05/23/17  4098     History   Chief Complaint Chief Complaint  Patient presents with  . Altered Mental Status    HPI Arthur Murray is a 26 y.o. male.  Patient with history of opiate use, ED visits 04/06/2017 after taking Percocet and Xanax -- presents with altered mental status. Level V caveat due to altered mental status. Girlfriend reports that the patient returned home at approximately 10:45 PM last evening. He was conversant but acting funny. She reports that he was moving his body in a strange way. She took care of him overnight, at one point putting him into a cold shower. After he did not improve this morning, patient assisted the patient to the car and drove him to the hospital for further evaluation. She is unsure of any previous medical problems. Patient did not admit to any substance abuse to girlfriend.      No past medical history on file.  There are no active problems to display for this patient.   No past surgical history on file.     Home Medications    Prior to Admission medications   Medication Sig Start Date End Date Taking? Authorizing Provider  ALPRAZolam (XANAX PO) Take 0.5 tablets by mouth once.    [provider]  doxycycline (VIBRAMYCIN) 100 MG capsule Take 1 capsule (100 mg total) by mouth 2 (two) times daily. One po bid x 7 days Patient not taking: Reported on 04/04/2017 05/01/14   Street, [place], PA-C  HYDROcodone-acetaminophen (NORCO/VICODIN) 5-325 MG per tablet 1 to 2 tabs every 4 to 6 hours as needed for pain. Patient not taking: Reported on 04/04/2017 04/28/14   Reuben Likes, MD  naproxen (NAPROSYN) 500 MG tablet Take 1 tablet (500 mg total) by mouth 2 (two) times daily as needed for mild pain, moderate pain or headache (TAKE WITH MEALS.). Patient not taking: Reported on 04/04/2017 05/01/14   Street, [place], PA-C  oxyCODONE-acetaminophen (PERCOCET) 10-325 MG tablet  Take 4 tablets by mouth once.    [provider]  oxyCODONE-acetaminophen (PERCOCET) 5-325 MG per tablet Take 1-2 tablets by mouth every 6 (six) hours as needed for severe pain. Patient not taking: Reported on 04/04/2017 05/01/14   Street, [place], PA-C    Family History No family history on file.  Social History Social History  Substance Use Topics  . Smoking status: Current Some Day Smoker    Packs/day: 0.50    Types: Cigarettes  . Smokeless tobacco: Never Used  . Alcohol use Yes     Allergies   Shellfish allergy   Review of Systems Review of Systems  Unable to perform ROS: Mental status change     Physical Exam Updated Vital Signs BP 110/67   Pulse 87   Resp 19   Wt 65.8 kg (145 lb)   SpO2 99%   BMI 19.67 kg/m   Physical Exam  Constitutional: He appears well-developed and well-nourished.  HENT:  Head: Normocephalic and atraumatic.  Right Ear: External ear normal.  Left Ear: External ear normal.  Nose: Nose normal.  Mouth/Throat: Uvula is midline and oropharynx is clear and moist.  Eyes: Conjunctivae are normal. Right eye exhibits no discharge. Left eye exhibits no discharge. Right pupil is round and reactive. Left pupil is round and reactive. Pupils are equal.  Pupils are dilated, responsive.   Neck: Normal range of motion. Neck supple.  Cardiovascular: Normal rate, regular rhythm and  normal heart sounds.   No murmur heard. Pulmonary/Chest: Effort normal and breath sounds normal. No respiratory distress. He has no wheezes. He has no rales.  Abdominal: Soft. There is no tenderness. There is no rebound and no guarding.  Neurological: He is disoriented.  Pt obtunded. Responds to pain. Unable to cooperate with neuro exam. Increased muscle tone.   Skin: Skin is warm and dry.  Psychiatric: He has a normal mood and affect.  Nursing note and vitals reviewed.    ED Treatments / Results  Labs (all labs ordered are listed, but only abnormal results are  displayed) Labs Reviewed  COMPREHENSIVE METABOLIC PANEL - Abnormal; Notable for the following:       Result Value   Potassium 5.3 (*)    Glucose, Bld 102 (*)    All other components within normal limits  URINALYSIS, COMPLETE (UACMP) WITH MICROSCOPIC - Abnormal; Notable for the following:    Bacteria, UA RARE (*)    All other components within normal limits  RAPID URINE DRUG SCREEN, HOSP PERFORMED - Abnormal; Notable for the following:    Tetrahydrocannabinol POSITIVE (*)    All other components within normal limits  CBG MONITORING, ED - Abnormal; Notable for the following:    Glucose-Capillary 105 (*)    All other components within normal limits  I-STAT CHEM 8, ED - Abnormal; Notable for the following:    Potassium 5.2 (*)    Chloride 100 (*)    Glucose, Bld 101 (*)    Hemoglobin 17.3 (*)    All other components within normal limits  CBC WITH DIFFERENTIAL/PLATELET  CK  ETHANOL  ACETAMINOPHEN LEVEL  SALICYLATE LEVEL  RAPID HIV SCREEN (HIV 1/2 AB+AG)    EKG  EKG Interpretation None       Radiology Ct Head Wo Contrast  Result Date: 05/23/2017 CLINICAL DATA:  Altered mental status with transient episode of unresponsiveness EXAM: CT HEAD WITHOUT CONTRAST TECHNIQUE: Contiguous axial images were obtained from the base of the skull through the vertex without intravenous contrast. COMPARISON:  None. FINDINGS: Brain: The ventricles are normal in size and configuration. There is no intracranial mass, hemorrhage, extra-axial fluid collection, or midline shift. Gray-white compartments appear normal. No evident acute infarct. Vascular: No hyperdense vessel. There is no appreciable arterial vascular calcification. Skull: Bony calvarium appears intact. Sinuses/Orbits: There is mild mucosal thickening in several ethmoid air cells bilaterally. Frontal sinuses are nearly aplastic. Visualized paranasal sinuses elsewhere clear. Orbits appear symmetric bilaterally. Other: Mastoid air cells are  clear. There is mild debris in the left external auditory canal. IMPRESSION: Mucosal thickening in several ethmoid air cells. Probable cerumen in the left external auditory canal. No intracranial mass, hemorrhage, or extra-axial fluid collection. Gray-white compartments appear normal. Electronically Signed   By: Bretta Bang III M.D.   On: 05/23/2017 11:17    Procedures Procedures (including critical care time)  Medications Ordered in ED Medications - No data to display   Initial Impression / Assessment and Plan / ED Course  I have reviewed the triage vital signs and the nursing notes.  Pertinent labs & imaging results that were available during my care of the patient were reviewed by me and considered in my medical decision making (see chart for details).     Patient seen and examined. Labs/imaging ordered. Discussed with and seen by Dr. Effie Shy. Maintaining airway with normal vitals at current time. Will monitor closely.   Vital signs reviewed and are as follows: BP 110/67   Pulse 87  Resp 19   Wt 65.8 kg (145 lb)   SpO2 99%   BMI 19.67 kg/m   10:29 AM Patient re-evaluated. Sleeping. Maintaining airway and O2 sat. Spoke with family member at bedside. She thinks he took something. Explained head CT upcoming, will monitor until he returns to baseline.   1:29 PM Pt re-evaluated. He is more alert to voice, but still not very talkative. He falls back to sleep quickly. Reportedly up to bathroom. Will need additional time to observe.   3:52 PM Patient re-evaluated. Continues to be more and more alert but still not conversant.   Reviewed plan with Dr. Effie Shy.   Will continue to monitor. May need TTS. Do not suspect CNS infection at this point.   Handoff to [facility] at shift change.   Final Clinical Impressions(s) / ED Diagnoses   Final diagnoses:  Encephalopathy acute   Pending improvement.   New Prescriptions New Prescriptions   No medications on file     Renne Crigler, Cordelia Poche 05/23/17 1557    Mancel Bale, MD 05/28/17 2213

## 2017-05-23 NOTE — Discharge Instructions (Signed)
Please avoid using illicit drugs as it can severely affect your health.  Return if your condition worsen.
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
## 2017-05-23 NOTE — ED Notes (Signed)
Pt CBG was 105, notified Anna(RN)

## 2017-05-23 NOTE — ED Notes (Signed)
Family at bedside. 

## 2017-05-23 NOTE — ED Notes (Signed)
Pt given crackers at RN's approval

## 2019-01-08 IMAGING — CT CT HEAD W/O CM
4 series · 16 of 47 positions shown, 18 images · non-contrast
Comparison: None.

CLINICAL DATA: Altered mental status with transient episode of
unresponsiveness

EXAM:
CT HEAD WITHOUT CONTRAST
TECHNIQUE: Contiguous axial images were obtained from the base of the skull
through the vertex without intravenous contrast.

[Series 3: head without · axial · non-contrast · 0.44mm/px · z∈[-150,-30]mm · 7 of 33 slices shown, 9 images]
[im 5/33  brain]
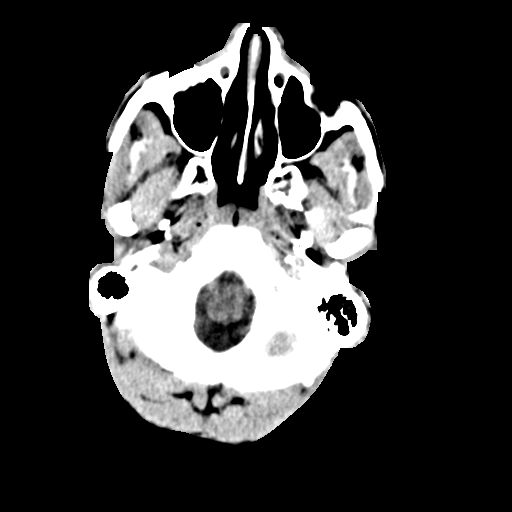
[im 5/33  bone]
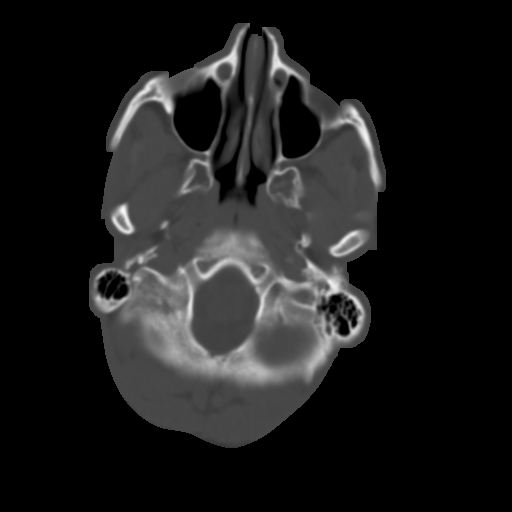
[im 9/33  brain]
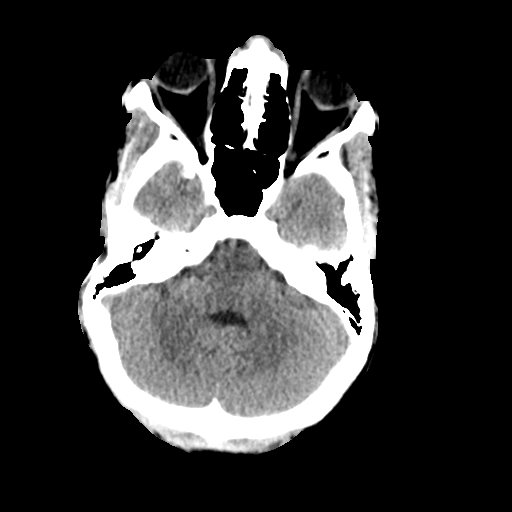
[im 13/33  brain]
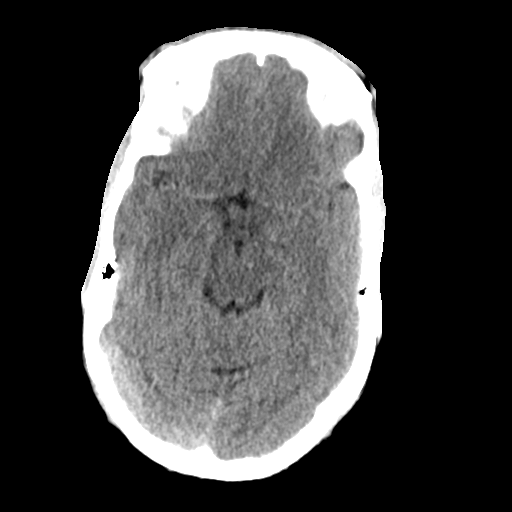
[im 17/33  brain]
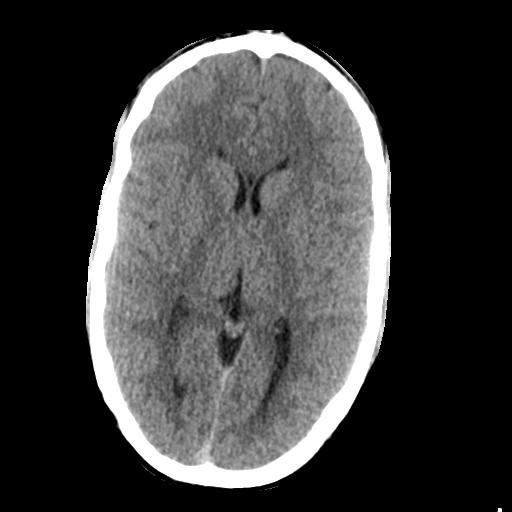
[im 21/33  brain]
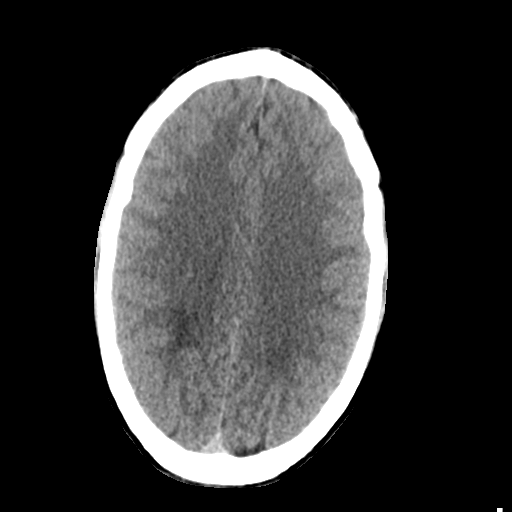
[im 21/33  bone]
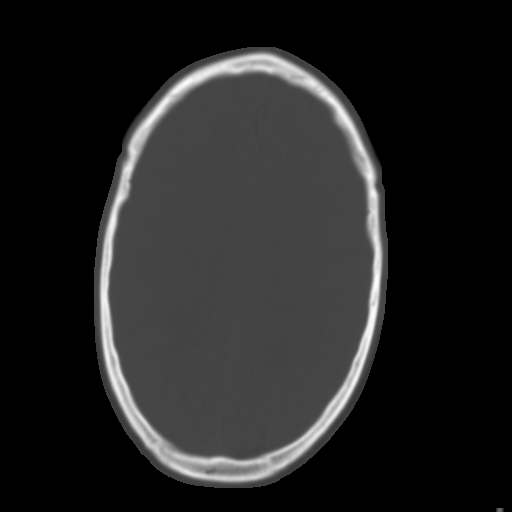
[im 25/33  brain]
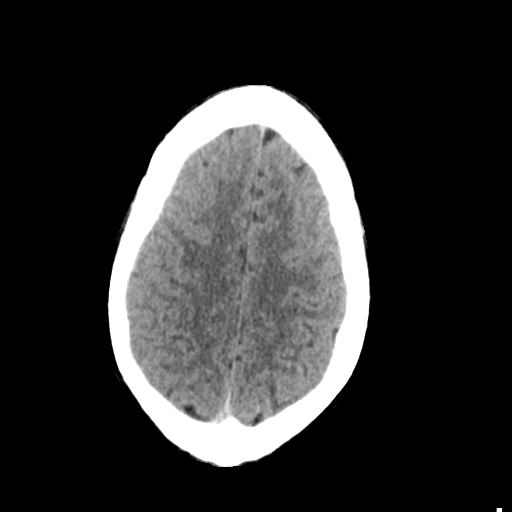
[im 29/33  brain]
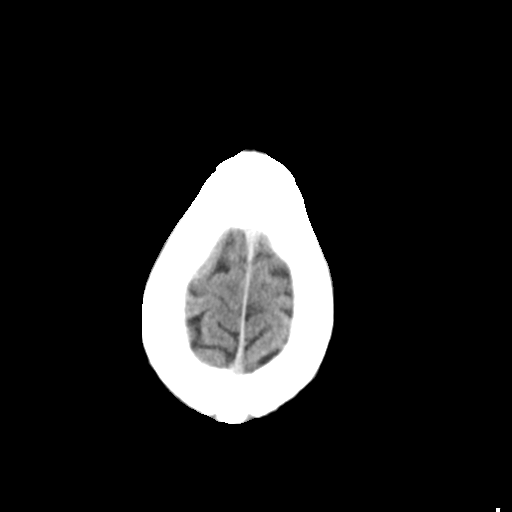

[Series 4: head bone · axial · 0.44mm/px · z∈[-154,-122]mm · 3 of 83 slices shown]
[im 9/83  bone]
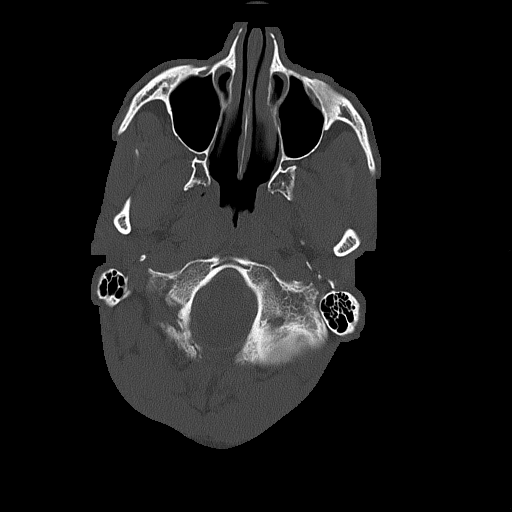
[im 17/83  bone]
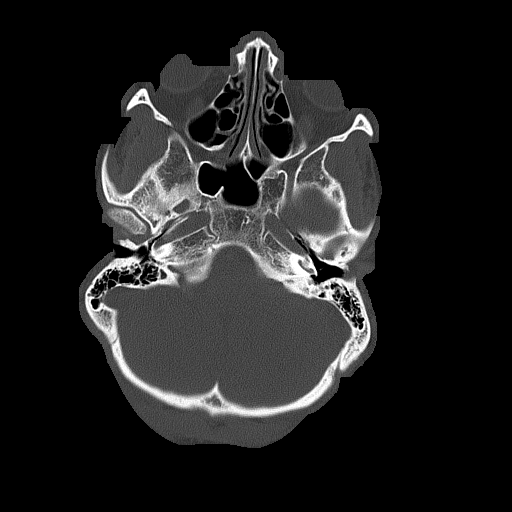
[im 25/83  bone]
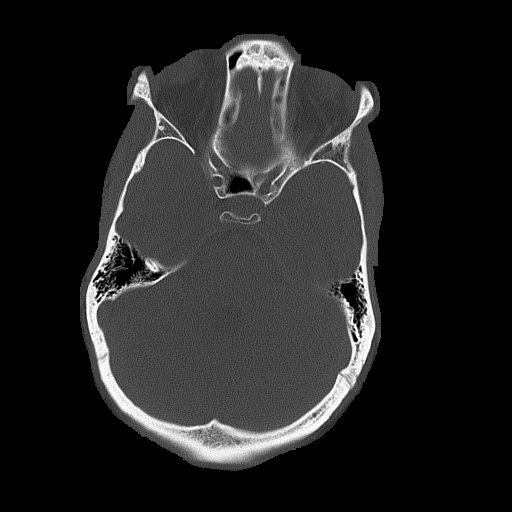

[Series 5: head without cor · coronal · non-contrast · 0.32mm/px · 3 of 73 slices shown]
[im 25/73  brain]
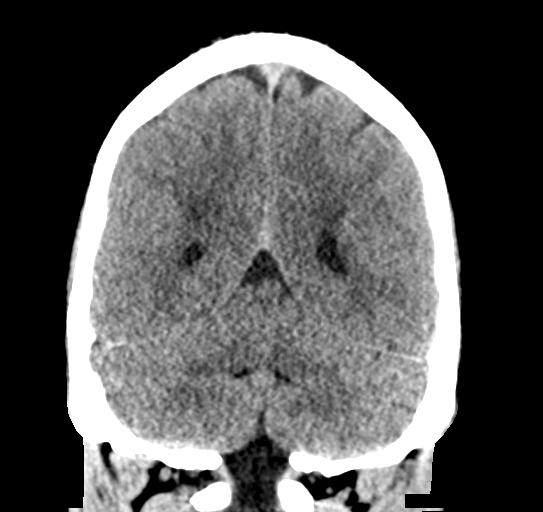
[im 33/73  brain]
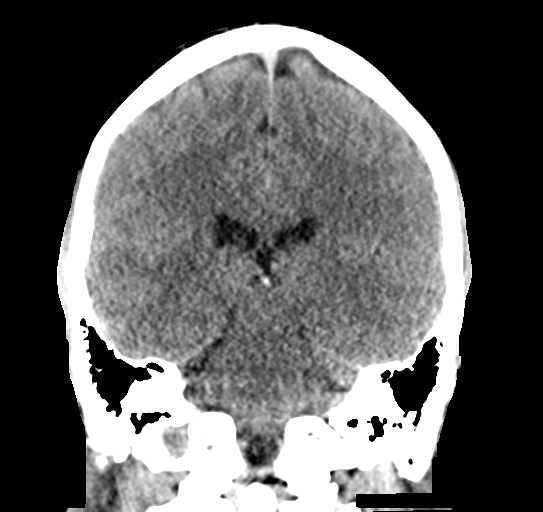
[im 41/73  brain]
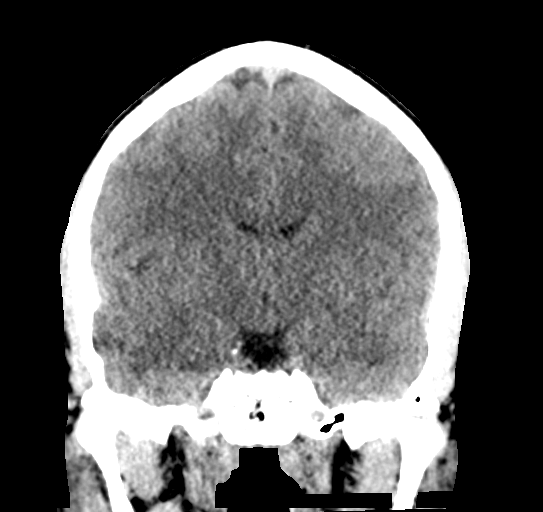

[Series 6: head without sag · sagittal · non-contrast · 0.32mm/px · 3 of 53 slices shown]
[im 18/53  brain]
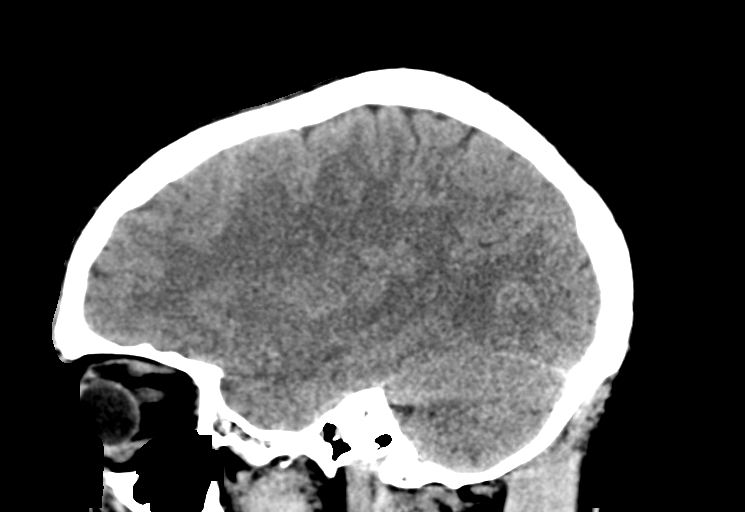
[im 27/53  brain]
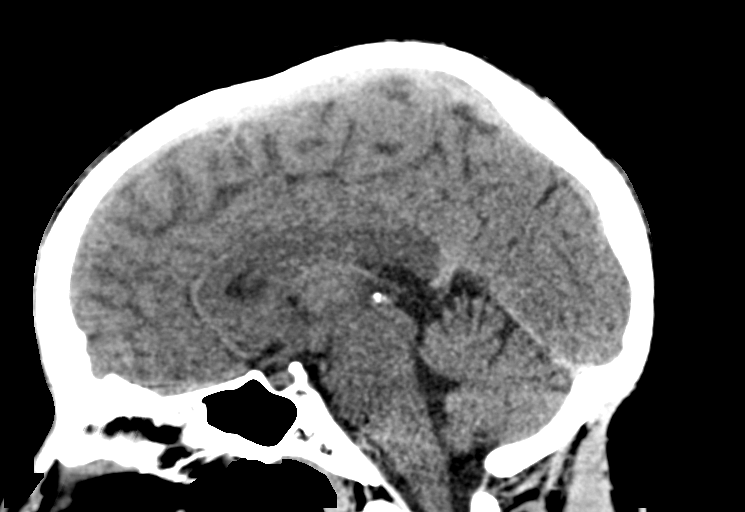
[im 35/53  brain]
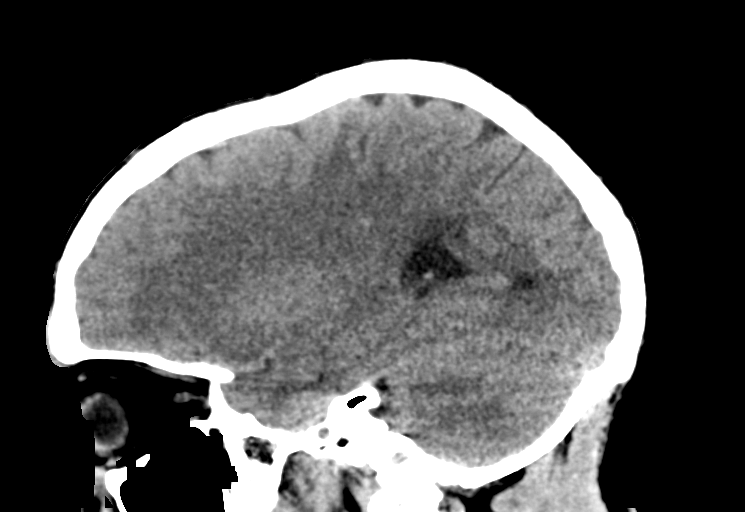

[16 of 47 positions shown; findings below may reference images not displayed]

FINDINGS: Brain: The ventricles are normal in size and configuration. There is
no intracranial mass, hemorrhage, extra-axial fluid collection, or
midline shift. Gray-white compartments appear normal. No evident
acute infarct.

Vascular: No hyperdense vessel. There is no appreciable arterial
vascular calcification.

Skull: Bony calvarium appears intact.

Sinuses/Orbits: There is mild mucosal thickening in several ethmoid
air cells bilaterally. Frontal sinuses are nearly aplastic.
Visualized paranasal sinuses elsewhere clear. Orbits appear
symmetric bilaterally.

Other: Mastoid air cells are clear. There is mild debris in the left
external auditory canal.
IMPRESSION: Mucosal thickening in several ethmoid air cells. Probable cerumen in
the left external auditory canal.

No intracranial mass, hemorrhage, or extra-axial fluid collection.
Gray-white compartments appear normal.

## 2020-07-18 ENCOUNTER — Encounter (HOSPITAL_COMMUNITY): Payer: Self-pay | Admitting: Emergency Medicine

## 2020-07-18 ENCOUNTER — Other Ambulatory Visit: Payer: Self-pay

## 2020-07-18 ENCOUNTER — Ambulatory Visit (HOSPITAL_COMMUNITY)
Admission: EM | Admit: 2020-07-18 | Discharge: 2020-07-18 | Disposition: A | Discharge status: Home / Self Care | Payer: Self-pay | Attending: Family Medicine | Admitting: Family Medicine

## 2020-07-18 DIAGNOSIS — R519 Headache, unspecified: Secondary | ICD-10-CM

## 2020-07-18 DIAGNOSIS — S161XXA Strain of muscle, fascia and tendon at neck level, initial encounter: Secondary | ICD-10-CM

## 2020-07-18 DIAGNOSIS — G44311 Acute post-traumatic headache, intractable: Secondary | ICD-10-CM

## 2020-07-18 MED ORDER — CYCLOBENZAPRINE HCL 10 MG PO TABS: 10.0000 mg | ORAL_TABLET | Freq: Three times a day (TID) | ORAL | 0 refills | Status: AC | PRN
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
## 2020-07-18 NOTE — Discharge Instructions (Signed)
You must be completely free of symptoms prior to returning to work
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
